# Patient Record
Sex: Male | Born: 2000 | Hispanic: Yes | Marital: Single | Smoking: Never smoker
Health system: Southern US, Community
[De-identification: ages and names within clinical notes are randomized; demographics above are authoritative.]

## PROBLEM LIST (undated history)

## (undated) DIAGNOSIS — E039 Hypothyroidism, unspecified: Secondary | ICD-10-CM

## (undated) HISTORY — DX: Hypothyroidism, unspecified: E03.9

## (undated) HISTORY — PX: MOUTH SURGERY: SHX715

## (undated) HISTORY — PX: EXCISION OF BACK LESION: SHX6597

---
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
## 2022-02-17 ENCOUNTER — Other Ambulatory Visit (INDEPENDENT_AMBULATORY_CARE_PROVIDER_SITE_OTHER): Payer: Self-pay | Admitting: Family

## 2022-02-17 DIAGNOSIS — E039 Hypothyroidism, unspecified: Secondary | ICD-10-CM

## 2022-03-16 ENCOUNTER — Encounter (INDEPENDENT_AMBULATORY_CARE_PROVIDER_SITE_OTHER): Payer: Self-pay | Admitting: Family

## 2022-03-16 ENCOUNTER — Ambulatory Visit (INDEPENDENT_AMBULATORY_CARE_PROVIDER_SITE_OTHER): Payer: Medicaid Other | Admitting: Family

## 2022-03-16 VITALS — BP 124/80 | HR 75 | Wt 196.0 lb

## 2022-03-16 DIAGNOSIS — E039 Hypothyroidism, unspecified: Secondary | ICD-10-CM | POA: Diagnosis not present

## 2022-03-16 DIAGNOSIS — Z23 Encounter for immunization: Secondary | ICD-10-CM

## 2022-03-16 NOTE — Patient Instructions (Signed)

## 2022-03-16 NOTE — Progress Notes (Signed)
Pediatric Endocrinology Consultation Follow up Visit  [name], [name] 2001/01/30  Gregg Mayhew, NP (Inactive)  Chief Complaint: Elevated TSH  History obtained from: Gregg [name], his mother, and review of records from PCP  HPI: Gregg [name]  is a 21 y.o. male being seen in consultation at the request of  Gregg Mayhew, NP (Inactive) for evaluation of elevated TSH.  he is accompanied to this visit by his Mother and Spanish interpreter.   1. Gregg [name] was seen by his PCP on 01/2018 for well child exam. Routine annual labs were drawn after the visit which showed and elevated TSH of 11.71 and FT4 of 1.13. At his first visit to clinic, his thyroid antibodies were positive and confirmed autoimmune thyroid disease.Gregg [name] He was started on 25 mcg of levothyroxine per day.   2. Since his last visit to clinic on 09/2021, he has been well.   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 He is taking 25 mcg of levothyroxine every morning. Rarely forgets a dose. Denies fatigue, constipation and cold intolerance.     ROS: Greater than 10 systems reviewed with pertinent positives listed in HPI, otherwise neg. Constitutional: Sleeping well. 13 lbs weight gain   Eyes: No changes in vision Ears/Nose/Mouth/Throat: No difficulty swallowing. No neck pain  Cardiovascular: No palpitations. No chest pain  Respiratory: No increased work of breathing Gastrointestinal: No constipation or diarrhea. No abdominal pain Genitourinary: No nocturia, no polyuria Musculoskeletal: No joint pain Neurologic: Normal sensation, no tremor Endocrine: As above Psychiatric: Normal affect   Past Medical History:  Past Medical History:  Diagnosis Date   Hypothyroidism     Birth History: Pregnancy uncomplicated. Delivered at 38 weeks.  Discharged home with mom  Meds: Outpatient Encounter Medications as of 03/16/2022  Medication Sig   levothyroxine  (SYNTHROID) 25 MCG tablet Take 1 tablet by mouth once daily   No facility-administered encounter medications on file as of 03/16/2022.    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   Family History:  Family History  Problem Relation Age of Onset   Thyroid disease Paternal Grandmother    Diabetes type II Paternal Grandmother    Diabetes type II Paternal Grandfather    Maternal Grandmother: hypothyroidism.   Social History: Lives with: Mother, father and 75 year old brother.  Currently in school at [employer]   Physical Exam:  Vitals:   03/16/22 0828  BP: 124/80  Pulse: 75  Weight: 196 lb (88.9 kg)     BP 124/80   Pulse 75   Wt 196 lb (88.9 kg)   BMI 29.10 kg/m  Body mass index: body mass index is 29.1 kg/m. Blood pressure %iles are not available for patients who are 18 years or older.  Wt Readings from Last 3 Encounters:  03/16/22 196 lb (88.9 kg) (91 %, Z= 1.33)*  09/14/21 183 lb 6.4 oz (83.2 kg) (85 %, Z= 1.05)*  03/18/21 181 lb (82.1 kg) (85 %, Z= 1.04)*   * Growth percentiles are based on CDC (Boys, 2-20 Years) data.   Ht Readings from Last 3 Encounters:  03/18/21 5' 8.82" (1.748 m) (41 %, Z= -0.23)*  09/16/20 5' 8.9" (1.75 m) (43 %, Z= -0.17)*  03/09/20 5' 9.09" (1.755 m) (48 %, Z= -0.05)*   * Growth percentiles are based on CDC (Boys, 2-20 Years) data.   Body mass index is 29.1 kg/m. @  BMIFA@ 91 %ile (Z= 1.33) based on CDC (Boys, 2-20 Years) weight-for-age data using vitals from 03/16/2022. No height on file for this encounter.  General: Well developed, well nourished male in no acute distress.   Head: Normocephalic, atraumatic.   Eyes:  Pupils equal and round. EOMI.  Sclera white.  No eye drainage.   Ears/Nose/Mouth/Throat: Nares patent, no nasal drainage.  Normal dentition, mucous membranes moist.  Neck: supple, no cervical lymphadenopathy, no thyromegaly Cardiovascular: regular rate, normal S1/S2, no  murmurs Respiratory: No increased work of breathing.  Lungs clear to auscultation bilaterally.  No wheezes. Abdomen: soft, nontender, nondistended. Normal bowel sounds.  No appreciable masses  Extremities: warm, well perfused, cap refill < 2 sec.   Musculoskeletal: Normal muscle mass.  Normal strength Skin: warm, dry.  No rash or lesions. Neurologic: alert and oriented, normal speech, no tremor    Laboratory Evaluation:     Assessment/Plan: Gregg [name] is a 21 y.o. 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 He is clinically euthyroid on 25 mcg of levothyroxine per day.   1. Aquired hypothyroidism.  2. Abnormal thyroid Function Test - 25 mcg of levothyroxine per day  - TSh, Ft4 and T4 ordered  - Reviewed growth chart.  - Discussed s/s of hypothyroidism.   Influenza vaccine given, counseling provided.    Follow-up:   6 months   Medical decision-making:  >30  spent today reviewing the medical chart, counseling the patient/family, and documenting today's visit.      Gretchen Short,  FNP-C  Pediatric Specialist  [address]  [place], [address]  Tele: [phone]

## 2022-03-17 LAB — TSH: TSH: 4.34 mIU/L — ABNORMAL HIGH (ref 0.50–4.30)

## 2022-03-17 LAB — T4, FREE: Free T4: 1.6 ng/dL — ABNORMAL HIGH (ref 0.8–1.4)

## 2022-03-17 LAB — T4: T4, Total: 6.5 ug/dL (ref 5.1–10.3)

## 2022-03-21 ENCOUNTER — Other Ambulatory Visit (INDEPENDENT_AMBULATORY_CARE_PROVIDER_SITE_OTHER): Payer: Self-pay

## 2022-03-21 DIAGNOSIS — E039 Hypothyroidism, unspecified: Secondary | ICD-10-CM

## 2022-03-21 MED ORDER — LEVOTHYROXINE SODIUM 25 MCG PO TABS: 25.0000 ug | ORAL_TABLET | Freq: Every day | ORAL | 2 refills | Status: DC

## 2022-09-20 ENCOUNTER — Encounter (INDEPENDENT_AMBULATORY_CARE_PROVIDER_SITE_OTHER): Payer: Self-pay | Admitting: Family

## 2022-09-20 ENCOUNTER — Ambulatory Visit (INDEPENDENT_AMBULATORY_CARE_PROVIDER_SITE_OTHER): Payer: Self-pay | Admitting: Family

## 2022-09-20 VITALS — BP 118/72 | HR 68 | Wt 206.0 lb

## 2022-09-20 DIAGNOSIS — E039 Hypothyroidism, unspecified: Secondary | ICD-10-CM

## 2022-09-20 NOTE — Progress Notes (Signed)
Pediatric Endocrinology Consultation Follow up Visit  Gregg [name], Gregg [name] 04-29-2001  Gregg Crazier, NP (Inactive)  Chief Complaint: Elevated TSH  History obtained from: Gregg [name], his mother, and review of records from PCP  HPI: Gregg [name]  is a 22 y.o. male being seen in consultation at the request of  Gregg Crazier, NP (Inactive) for evaluation of elevated TSH.  he is accompanied to this visit by his Mother and Spanish interpreter.   1. Gregg [name] was seen by his PCP on 01/2018 for well child exam. Routine annual labs were drawn after the visit which showed and elevated TSH of 11.71 and FT4 of 1.13. At his first visit to clinic, his thyroid antibodies were positive and confirmed autoimmune thyroid disease.Marland Kitchen He was started on 25 mcg of levothyroxine per day.   2. Since his last visit to clinic on 02/2022 , he has been well.   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 He goes to the gym a few days per week for activity. Eats healthy diet.   He is taking 25 mcg of levothyroxine per day. Rarely misses a dose and takes in the morning. Denies fatigue, cold intolerance, and constipation. His only concern is hair loss over the last week.   ROS: Greater than 10 systems reviewed with pertinent positives listed in HPI, otherwise neg. Constitutional: Sleeping well. + 10lbs weight gain.  Eyes: No changes in vision Ears/Nose/Mouth/Throat: No difficulty swallowing. No neck pain  Cardiovascular: No palpitations. No chest pain  Respiratory: No increased work of breathing Gastrointestinal: No constipation or diarrhea. No abdominal pain Genitourinary: No nocturia, no polyuria Musculoskeletal: No joint pain Neurologic: Normal sensation, no tremor Endocrine: As above Psychiatric: Normal affect   Past Medical History:  Past Medical History:  Diagnosis Date   Hypothyroidism     Birth History: Pregnancy uncomplicated. Delivered at 38 weeks.  Discharged home with  mom  Meds: Outpatient Encounter Medications as of 09/20/2022  Medication Sig   levothyroxine (SYNTHROID) 25 MCG tablet Take 1 tablet (25 mcg total) by mouth daily.   No facility-administered encounter medications on file as of 09/20/2022.    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   Family History:  Family History  Problem Relation Age of Onset   Thyroid disease Paternal Grandmother    Diabetes type II Paternal Grandmother    Diabetes type II Paternal Grandfather    Maternal Grandmother: hypothyroidism.   Social History: Lives with: Mother, father and 61 year old brother.  Currently in school at [employer]   Physical Exam:  Vitals:   09/20/22 1508  BP: 118/72  Pulse: 68  Weight: 206 lb (93.4 kg)      BP 118/72   Pulse 68   Wt 206 lb (93.4 kg)   BMI 30.58 kg/m  Body mass index: body mass index is 30.58 kg/m. Growth %ile SmartLinks can only be used for patients less than 56 years old.  Wt Readings from Last 3 Encounters:  09/20/22 206 lb (93.4 kg)  03/16/22 196 lb (88.9 kg) (91 %, Z= 1.33)*  09/14/21 183 lb 6.4 oz (83.2 kg) (85 %, Z= 1.05)*   * Growth percentiles are based on CDC (Boys, 2-20 Years) data.   Ht Readings from Last 3 Encounters:  03/18/21 5' 8.82" (1.748 m) (41 %, Z= -0.23)*  09/16/20 5' 8.9" (1.75 m) (43 %, Z= -0.17)*  03/09/20 5' 9.09" (1.755 m) (48 %, Z= -0.05)*   * Growth  percentiles are based on CDC (Boys, 2-20 Years) data.   Body mass index is 30.58 kg/m. @BMIFA @ Facility age limit for growth %iles is 20 years. Facility age limit for growth %iles is 20 years.  General: Well developed, well nourished male in no acute distress.   Head: Normocephalic, atraumatic.   Eyes:  Pupils equal and round. EOMI.  Sclera white.  No eye drainage.   Ears/Nose/Mouth/Throat: Nares patent, no nasal drainage.  Normal dentition, mucous membranes moist.  Neck: supple, no cervical lymphadenopathy, no  thyromegaly Cardiovascular: regular rate, normal S1/S2, no murmurs Respiratory: No increased work of breathing.  Lungs clear to auscultation bilaterally.  No wheezes. Abdomen: soft, nontender, nondistended. Normal bowel sounds.  No appreciable masses  Extremities: warm, well perfused, cap refill < 2 sec.   Musculoskeletal: Normal muscle mass.  Normal strength Skin: warm, dry.  No rash or lesions. Neurologic: alert and oriented, normal speech, no tremor    Laboratory Evaluation:     Assessment/Plan: Gregg [name] is a 22 y.o. 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 He is on levothyroxine and clinically euthyroid.   1. Aquired hypothyroidism.  2. Abnormal thyroid Function Test - Reviewed and discussed growth chart.  - TSH, FT4 and T4 ordered  - 25 mcg of levothyroxine per day.      Follow-up:   6 months   Medical decision-making:  LOS: >30  spent today reviewing the medical chart, counseling the patient/family, and documenting today's visit.     Gregg Short,  FNP-C  Pediatric Specialist  [address]  [place], [address]  Tele: [phone]

## 2022-09-20 NOTE — Patient Instructions (Signed)
It was a pleasure seeing you in clinic today. Please do not hesitate to contact me if you have questions or concerns.  ° °Please sign up for MyChart. This is a communication tool that allows you to send an email directly to me. This can be used for questions, prescriptions and blood sugar reports. We will also release labs to you with instructions on MyChart. Please do not use MyChart if you need immediate or emergency assistance. Ask our wonderful front office staff if you need assistance.  ° °

## 2022-09-21 LAB — TSH: TSH: 4.66 mIU/L — ABNORMAL HIGH (ref 0.40–4.50)

## 2022-09-21 LAB — T4, FREE: Free T4: 1.6 ng/dL — ABNORMAL HIGH (ref 0.8–1.4)

## 2022-09-21 LAB — T4: T4, Total: 6.7 ug/dL (ref 5.1–10.3)

## 2022-12-16 ENCOUNTER — Other Ambulatory Visit (INDEPENDENT_AMBULATORY_CARE_PROVIDER_SITE_OTHER): Payer: Self-pay | Admitting: Family

## 2022-12-16 DIAGNOSIS — E039 Hypothyroidism, unspecified: Secondary | ICD-10-CM

## 2023-03-22 ENCOUNTER — Encounter (INDEPENDENT_AMBULATORY_CARE_PROVIDER_SITE_OTHER): Payer: Self-pay

## 2023-03-22 ENCOUNTER — Ambulatory Visit (INDEPENDENT_AMBULATORY_CARE_PROVIDER_SITE_OTHER): Payer: Medicaid Other | Admitting: Family

## 2023-03-22 ENCOUNTER — Telehealth (INDEPENDENT_AMBULATORY_CARE_PROVIDER_SITE_OTHER): Payer: Self-pay | Admitting: Family

## 2023-03-22 ENCOUNTER — Other Ambulatory Visit (INDEPENDENT_AMBULATORY_CARE_PROVIDER_SITE_OTHER): Payer: Self-pay | Admitting: Family

## 2023-03-22 DIAGNOSIS — E039 Hypothyroidism, unspecified: Secondary | ICD-10-CM

## 2023-03-22 NOTE — Progress Notes (Deleted)
Pediatric Endocrinology Consultation Follow up Visit  Gregg [name], Gregg [name] [birth year]  Melanie Crazier, NP (Inactive)  Chief Complaint: Elevated TSH  History obtained from: Victor, his mother, and review of records from PCP  HPI: Gregg [name]  is a 22 y.o. male being seen in consultation at the request of  Melanie Crazier, NP (Inactive) for evaluation of elevated TSH.  he is accompanied to this visit by his Mother and Spanish interpreter.   1. Gregg [name] was seen by his PCP on 01/2018 for well child exam. Routine annual labs were drawn after the visit which showed and elevated TSH of 11.71 and FT4 of 1.13. At his first visit to clinic, his thyroid antibodies were positive and confirmed autoimmune thyroid disease.Marland Kitchen He was started on 25 mcg of levothyroxine per day.   2. Since his last visit to clinic on  09/2022, he has been well.   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 He goes to the gym a few days per week for activity. Eats healthy diet.   He is taking 25 mcg of levothyroxine per day. Rarely misses a dose and takes in the morning. Denies fatigue, cold intolerance, and constipation. His only concern is hair loss over the last week.   ROS: Greater than 10 systems reviewed with pertinent positives listed in HPI, otherwise neg. Constitutional: Sleeping well. + 10lbs weight gain.  Eyes: No changes in vision Ears/Nose/Mouth/Throat: No difficulty swallowing. No neck pain  Cardiovascular: No palpitations. No chest pain  Respiratory: No increased work of breathing Gastrointestinal: No constipation or diarrhea. No abdominal pain Genitourinary: No nocturia, no polyuria Musculoskeletal: No joint pain Neurologic: Normal sensation, no tremor Endocrine: As above Psychiatric: Normal affect   Past Medical History:  Past Medical History:  Diagnosis Date   Hypothyroidism     Birth History: Pregnancy uncomplicated. Delivered at 38 weeks.  Discharged home with  mom  Meds: Outpatient Encounter Medications as of 03/22/2023  Medication Sig   levothyroxine (SYNTHROID) 25 MCG tablet Take 1 tablet by mouth once daily   No facility-administered encounter medications on file as of 03/22/2023.    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   Family History:  Family History  Problem Relation Age of Onset   Thyroid disease Paternal Grandmother    Diabetes type II Paternal Grandmother    Diabetes type II Paternal Grandfather    Maternal Grandmother: hypothyroidism.   Social History: Lives with: Mother, father and 42 year old brother.  Currently in school at [employer]   Physical Exam:  There were no vitals filed for this visit.     There were no vitals taken for this visit. Body mass index: body mass index is unknown because there is no height or weight on file. Growth %ile SmartLinks can only be used for patients less than 62 years old.  Wt Readings from Last 3 Encounters:  09/20/22 206 lb (93.4 kg)  03/16/22 196 lb (88.9 kg) (91%, Z= 1.33)*  09/14/21 183 lb 6.4 oz (83.2 kg) (85%, Z= 1.05)*   * Growth percentiles are based on CDC (Boys, 2-20 Years) data.   Ht Readings from Last 3 Encounters:  03/18/21 5' 8.82" (1.748 m) (41%, Z= -0.23)*  09/16/20 5' 8.9" (1.75 m) (43%, Z= -0.17)*  03/09/20 5' 9.09" (1.755 m) (48%, Z= -0.05)*   * Growth percentiles are based on CDC (Boys, 2-20 Years) data.   There is no height or weight on file to  calculate BMI. @BMIFA @ Facility age limit for growth %iles is 20 years. Facility age limit for growth %iles is 20 years.  General: Well developed, well nourished male in no acute distress.   Head: Normocephalic, atraumatic.   Eyes:  Pupils equal and round. EOMI.  Sclera white.  No eye drainage.   Ears/Nose/Mouth/Throat: Nares patent, no nasal drainage.  Normal dentition, mucous membranes moist.  Neck: supple, no cervical lymphadenopathy, no  thyromegaly Cardiovascular: regular rate, normal S1/S2, no murmurs Respiratory: No increased work of breathing.  Lungs clear to auscultation bilaterally.  No wheezes. Abdomen: soft, nontender, nondistended. Normal bowel sounds.  No appreciable masses  Extremities: warm, well perfused, cap refill < 2 sec.   Musculoskeletal: Normal muscle mass.  Normal strength Skin: warm, dry.  No rash or lesions. Neurologic: alert and oriented, normal speech, no tremor    Laboratory Evaluation:     Assessment/Plan: Gregg [name] is a 22 y.o. 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 He is on levothyroxine and clinically euthyroid.   1. Aquired hypothyroidism.  2. Abnormal thyroid Function Test - 25 mcg of levothyroxine per day  - Discussed s/s of hypothyroidism  -     Follow-up:   6 months   Medical decision-making:  LOS: >30  spent today reviewing the medical chart, counseling the patient/family, and documenting today's visit.     Gretchen Short,  FNP-C  Pediatric Specialist  [address]  [place], [address]  Tele: [phone]

## 2023-03-22 NOTE — Telephone Encounter (Signed)
  Name of who is calling: Gregg [name]   Caller's Relationship to Patient:  Best contact number: [phone]  Provider they see: Ovidio Kin   Reason for call: Called to rs missed appt from today, says he thought it was for tomorrow. He would like a call back for wether or not to still come in for labs this week or closer to his rs appt in January.      PRESCRIPTION REFILL ONLY  Name of prescription:  Pharmacy:

## 2023-03-31 LAB — T4, FREE: Free T4: 1.7 ng/dL — ABNORMAL HIGH (ref 0.8–1.4)

## 2023-03-31 LAB — TSH: TSH: 4.24 m[IU]/L (ref 0.40–4.50)

## 2023-03-31 LAB — T4: T4, Total: 6.8 ug/dL (ref 5.1–10.3)

## 2023-06-19 ENCOUNTER — Encounter (INDEPENDENT_AMBULATORY_CARE_PROVIDER_SITE_OTHER): Payer: Self-pay | Admitting: Family

## 2023-06-19 ENCOUNTER — Ambulatory Visit (INDEPENDENT_AMBULATORY_CARE_PROVIDER_SITE_OTHER): Payer: No Typology Code available for payment source | Admitting: Family

## 2023-06-19 VITALS — BP 128/84 | HR 86 | Wt 206.2 lb

## 2023-06-19 DIAGNOSIS — E039 Hypothyroidism, unspecified: Secondary | ICD-10-CM

## 2023-06-19 DIAGNOSIS — E063 Autoimmune thyroiditis: Secondary | ICD-10-CM

## 2023-06-19 NOTE — Patient Instructions (Signed)

## 2023-06-19 NOTE — Progress Notes (Signed)
 Pediatric Endocrinology Consultation Follow up Visit  Gregg, [name] [birth year]  Gregg Barns, NP (Inactive)  Chief Complaint: Elevated TSH  History obtained from: Gregg [name], his mother, and review of records from PCP  HPI: Gregg [name]  is a 23 y.o. male being seen in consultation at the request of  Gregg Barns, NP (Inactive) for evaluation of elevated TSH.  he is accompanied to this visit by his Mother and Spanish interpreter.   1. Gregg [name] was seen by his PCP on 01/2018 for well child exam. Routine annual labs were drawn after the visit which showed and elevated TSH of 11.71 and FT4 of 1.13. At his first visit to clinic, his thyroid  antibodies were positive and confirmed autoimmune thyroid  disease.Gregg [name] He was started on 25 mcg of levothyroxine  per day.   2. Since his last visit to clinic on 09/2022 , he has been well.   He is working full time as a games developer. He is working out at [employer] 3-4 x per week and goes for walks. Feels like his diet has been good overall.   He takes 25 mcg of levothyroxine  every morning. He has only missed a few doses. He denies fatigue, constipation and cold intolerance.   ROS: Greater than 10 systems reviewed with pertinent positives listed in HPI, otherwise neg. Constitutional: Sleeping well. Weight is stable   Eyes: No changes in vision Ears/Nose/Mouth/Throat: No difficulty swallowing. No neck pain  Cardiovascular: No palpitations. No chest pain  Respiratory: No increased work of breathing Gastrointestinal: No constipation or diarrhea. No abdominal pain Genitourinary: No nocturia, no polyuria Musculoskeletal: No joint pain Neurologic: Normal sensation, no tremor Endocrine: As above Psychiatric: Normal affect   Past Medical History:  Past Medical History:  Diagnosis Date   Hypothyroidism     Birth History: Pregnancy uncomplicated. Delivered at 38 weeks.  Discharged home with mom  Meds: Outpatient Encounter Medications as of 06/19/2023   Medication Sig   levothyroxine  (SYNTHROID ) 25 MCG tablet Take 1 tablet by mouth once daily   No facility-administered encounter medications on file as of 06/19/2023.    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   Family History:  Family History  Problem Relation Age of Onset   Thyroid  disease Paternal Grandmother    Diabetes type II Paternal Grandmother    Diabetes type II Paternal Grandfather    Maternal Grandmother: hypothyroidism.   Social History: Lives with: Mother, father and 75 year old brother.  Currently in school at [employer]   Physical Exam:  Vitals:   06/19/23 1314  BP: 128/84  Pulse: 86  Weight: 206 lb 3.2 oz (93.5 kg)     BP 128/84 (BP Location: Left Arm, Patient Position: Sitting, Cuff Size: Normal)   Pulse 86   Wt 206 lb 3.2 oz (93.5 kg)   BMI 30.61 kg/m  Body mass index: body mass index is 30.61 kg/m. Growth %ile SmartLinks can only be used for patients less than 14 years old.  Wt Readings from Last 3 Encounters:  06/19/23 206 lb 3.2 oz (93.5 kg)  09/20/22 206 lb (93.4 kg)  03/16/22 196 lb (88.9 kg) (91%, Z= 1.33)*   * Growth percentiles are based on CDC (Boys, 2-20 Years) data.   Ht Readings from Last 3 Encounters:  03/18/21 5' 8.82 (1.748 m) (41%, Z= -0.23)*  09/16/20 5' 8.9 (1.75 m) (43%, Z= -0.17)*  03/09/20 5' 9.09 (1.755 m) (48%, Z= -0.05)*   * Growth percentiles are based on CDC (Boys,  2-20 Years) data.   Body mass index is 30.61 kg/m. @BMIFA @ Facility age limit for growth %iles is 20 years. Facility age limit for growth %iles is 20 years.  General: Well developed, well nourished male in no acute distress.   Head: Normocephalic, atraumatic.   Eyes:  Pupils equal and round. EOMI.  Sclera white.  No eye drainage.   Ears/Nose/Mouth/Throat: Nares patent, no nasal drainage.  Normal dentition, mucous membranes moist.  Neck: supple, no cervical lymphadenopathy, no thyromegaly Cardiovascular:  regular rate, normal S1/S2, no murmurs Respiratory: No increased work of breathing.  Lungs clear to auscultation bilaterally.  No wheezes. Abdomen: soft, nontender, nondistended. Normal bowel sounds.  No appreciable masses  Extremities: warm, well perfused, cap refill < 2 sec.   Musculoskeletal: Normal muscle mass.  Normal strength Skin: warm, dry.  No rash or lesions. Neurologic: alert and oriented, normal speech, no tremor    Laboratory Evaluation:     Assessment/Plan: Gregg [name] is a 23 y.o. 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 Gregg [name] is clinically euthyroid on 25 mcg of levothyroxine  per day. Due for labs today.   1. Aquired hypothyroidism.  - TSH, FT4 and T4 ordered  - 25 mcg of levothyroxine  per day  - Discussed s/s of hypothyroidism  - Reviewed growth chart.     Follow-up:   6 months   Medical decision-making:  LOS: 21 minutes spent today reviewing the medical chart, counseling the patient/family, and documenting today's visit.     Gregg Penton,  FNP-C  Pediatric Specialist  [address]  [place], [address]  Tele: [phone]

## 2023-06-20 ENCOUNTER — Other Ambulatory Visit (INDEPENDENT_AMBULATORY_CARE_PROVIDER_SITE_OTHER): Payer: Self-pay | Admitting: Family

## 2023-06-20 DIAGNOSIS — E039 Hypothyroidism, unspecified: Secondary | ICD-10-CM

## 2023-06-20 LAB — T4, FREE: Free T4: 1.6 ng/dL — ABNORMAL HIGH (ref 0.8–1.4)

## 2023-06-20 LAB — T4: T4, Total: 6.9 ug/dL (ref 5.1–10.3)

## 2023-06-20 LAB — TSH: TSH: 4.22 m[IU]/L (ref 0.40–4.50)

## 2023-06-20 MED ORDER — LEVOTHYROXINE SODIUM 25 MCG PO TABS: 25.0000 ug | ORAL_TABLET | Freq: Every day | ORAL | 1 refills | Status: DC

## 2023-09-18 ENCOUNTER — Encounter (INDEPENDENT_AMBULATORY_CARE_PROVIDER_SITE_OTHER): Payer: Self-pay

## 2023-10-01 ENCOUNTER — Encounter (INDEPENDENT_AMBULATORY_CARE_PROVIDER_SITE_OTHER): Payer: Self-pay

## 2023-10-24 ENCOUNTER — Encounter (INDEPENDENT_AMBULATORY_CARE_PROVIDER_SITE_OTHER): Payer: Self-pay | Admitting: Family

## 2023-10-24 ENCOUNTER — Ambulatory Visit (INDEPENDENT_AMBULATORY_CARE_PROVIDER_SITE_OTHER): Admitting: Family

## 2023-10-24 VITALS — BP 126/78 | HR 76 | Wt 211.0 lb

## 2023-10-24 DIAGNOSIS — E039 Hypothyroidism, unspecified: Secondary | ICD-10-CM | POA: Diagnosis not present

## 2023-10-24 NOTE — Progress Notes (Signed)
 Pediatric Endocrinology Consultation Follow up Visit  Gregg Gregg [name], Gregg Gregg [name] 11-25-2000  Gregg Hunt, NP (Inactive)  Chief Complaint: Elevated TSH  History obtained from: Gregg Gregg [name], Gregg Gregg [name], and review of records from PCP  HPI: Gregg Gregg [name]  is a 23 y.o. male being seen in consultation at the request of  Gregg Hunt, NP (Inactive) for evaluation of elevated TSH.  he is accompanied to this visit by Gregg Gregg [name] and Spanish interpreter.   1. Gregg Gregg [name] was seen by Gregg PCP on 01/2018 for well child exam. Routine annual labs were drawn after the visit which showed and elevated TSH of 11.71 and FT4 of 1.13. At Gregg first visit to clinic, Gregg thyroid  antibodies were positive and confirmed autoimmune thyroid  disease.Gregg Gregg [name] He was started on 25 mcg of levothyroxine  per day.   2. Since Gregg last visit to clinic on 06/2023, he has been well.   He is staying active with work and playing soccer/going to the gym. Diet is healthy and balanced.   Takes 25 mcg of levothyroxine  per day, rarely misses a dose. Denies fatigue, constipation and cold intolerance.   ROS: Greater than 10 systems reviewed with pertinent positives listed in HPI, otherwise neg. Constitutional: Sleeping well HEENT: NO vision changes. No difficulty swallowing.  Respiratory: No increased work of breathing currently GI: No constipation or diarrhea Musculoskeletal: No joint deformity Neuro: Normal affect. No tremors.  Endocrine: As above    Past Medical History:  Past Medical History:  Diagnosis Date   Hypothyroidism     Birth History: Pregnancy uncomplicated. Delivered at 38 weeks.  Discharged home with mom  Meds: Outpatient Encounter Medications as of 10/24/2023  Medication Sig   levothyroxine  (SYNTHROID ) 25 MCG tablet Take 1 tablet (25 mcg total) by mouth daily.   No facility-administered encounter medications on file as of 10/24/2023.    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    Family History:  Family History  Problem Relation Age of Onset   Thyroid  disease Paternal Grandmother    Diabetes type II Paternal Grandmother    Diabetes type II Paternal Grandfather    Maternal Grandmother: hypothyroidism.   Social History: Lives with: Gregg [name], father and 1 year old brother.  Currently in school at [employer]   Physical Exam:  Vitals:   10/24/23 1342  BP: 126/78  Pulse: 76  Weight: 211 lb (95.7 kg)      BP 126/78   Pulse 76   Wt 211 lb (95.7 kg)   BMI 31.32 kg/m  Body mass index: body mass index is 31.32 kg/m. Growth %ile SmartLinks can only be used for patients less than 74 years old.  Wt Readings from Last 3 Encounters:  10/24/23 211 lb (95.7 kg)  06/19/23 206 lb 3.2 oz (93.5 kg)  09/20/22 206 lb (93.4 kg)   Ht Readings from Last 3 Encounters:  03/18/21 5' 8.82" (1.748 m) (41%, Z= -0.23)*  09/16/20 5' 8.9" (1.75 m) (43%, Z= -0.17)*  03/09/20 5' 9.09" (1.755 m) (48%, Z= -0.05)*   * Growth percentiles are based on CDC (Boys, 2-20 Years) data.   Body mass index is 31.32 kg/m. @BMIFA @ Facility age limit for growth %iles is 20 years. Facility age limit for growth %iles is 20 years.  General: Well developed, well nourished male in no acute distress.   Head: Normocephalic, atraumatic.   Eyes:  Pupils equal and round. EOMI.  Sclera white.  No eye drainage.   Ears/Nose/Mouth/Throat: Nares patent, no nasal drainage.  Normal dentition,  mucous membranes moist.  Neck: supple, no cervical lymphadenopathy, no thyromegaly Cardiovascular: regular rate, normal S1/S2, no murmurs Respiratory: No increased work of breathing.  Lungs clear to auscultation bilaterally.  No wheezes. Abdomen: soft, nontender, nondistended. Normal bowel sounds.  No appreciable masses  Extremities: warm, well perfused, cap refill < 2 sec.   Musculoskeletal: Normal muscle mass.  Normal strength Skin: warm, dry.  No rash or lesions. Neurologic: alert and oriented, normal speech, no  tremor    Laboratory Evaluation:     Assessment/Plan: Gregg Gregg [name] is a 23 y.o. 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 He is clinically euthyroid on levothyroxine .   1. Aquired hypothyroidism.  - 25 mcg of levothyroxine   - Discussed s/s of hypothyroidism.  - Follow up with PCP for management of hypothyroidism. Discussed in detail with patient.  Lab Orders         TSH         T4, free         T4       Follow-up:   6 months with PCP.   Medical decision-making:  LOS: 19 minutes  spent today reviewing the medical chart, counseling the patient/family, and documenting today's visit.    Candee Cha, DNP, FNP-C  Pediatric Specialist  [address]  [place], [address]  Tele: [phone]

## 2023-10-24 NOTE — Patient Instructions (Signed)
 It was a pleasure seeing you in clinic today. Please do not hesitate to contact me if you have questions or concerns.   Please sign up for MyChart. This is a communication tool that allows you to send an email directly to me. This can be used for questions, prescriptions and blood sugar reports. We will also release labs to you with instructions on MyChart. Please do not use MyChart if you need immediate or emergency assistance. Ask our wonderful front office staff if you need assistance.   -Take your medication at the same time every day -Try to take it on an empty stomach -If you forget to take a dose, take it as soon as you remember.  If you don't remember until the next day, take 2 doses then.  NEVER take more than 2 doses at a time. -Use a pill box to help make it easier to keep track of doses   - FOllow up with PCP for hypothyroidism. If PCP is not comfortable managing then refer to adult endocrinology   - 25 mcg of levothyroxine  per day

## 2023-10-25 LAB — T4: T4, Total: 2.4 ug/dL — ABNORMAL LOW (ref 4.9–10.5)

## 2023-10-25 LAB — T4, FREE: Free T4: 0.9 ng/dL (ref 0.8–1.8)

## 2023-10-25 LAB — TSH: TSH: 112.36 m[IU]/L — ABNORMAL HIGH (ref 0.40–4.50)

## 2023-10-29 ENCOUNTER — Other Ambulatory Visit (INDEPENDENT_AMBULATORY_CARE_PROVIDER_SITE_OTHER): Payer: Self-pay | Admitting: Family

## 2023-10-29 ENCOUNTER — Ambulatory Visit (INDEPENDENT_AMBULATORY_CARE_PROVIDER_SITE_OTHER): Payer: Self-pay | Admitting: Family

## 2023-10-29 DIAGNOSIS — E039 Hypothyroidism, unspecified: Secondary | ICD-10-CM

## 2023-10-29 MED ORDER — LEVOTHYROXINE SODIUM 50 MCG PO TABS: 50.0000 ug | ORAL_TABLET | Freq: Every day | ORAL | 3 refills | Status: DC

## 2023-11-10 LAB — TSH: TSH: >150 m[IU]/L — ABNORMAL HIGH (ref 0.40–4.50)

## 2023-11-10 LAB — T4: T4, Total: 2.7 ug/dL — ABNORMAL LOW (ref 4.9–10.5)

## 2023-11-10 LAB — T4, FREE: Free T4: 0.8 ng/dL (ref 0.8–1.8)

## 2023-11-14 ENCOUNTER — Ambulatory Visit (INDEPENDENT_AMBULATORY_CARE_PROVIDER_SITE_OTHER): Payer: Self-pay | Admitting: Family

## 2023-11-14 ENCOUNTER — Other Ambulatory Visit (INDEPENDENT_AMBULATORY_CARE_PROVIDER_SITE_OTHER): Payer: Self-pay | Admitting: Family

## 2023-11-14 DIAGNOSIS — E039 Hypothyroidism, unspecified: Secondary | ICD-10-CM

## 2023-11-19 ENCOUNTER — Other Ambulatory Visit (INDEPENDENT_AMBULATORY_CARE_PROVIDER_SITE_OTHER): Payer: Self-pay | Admitting: Family

## 2023-11-19 ENCOUNTER — Other Ambulatory Visit

## 2023-11-19 DIAGNOSIS — E063 Autoimmune thyroiditis: Secondary | ICD-10-CM

## 2023-12-17 ENCOUNTER — Encounter (INDEPENDENT_AMBULATORY_CARE_PROVIDER_SITE_OTHER): Payer: Self-pay

## 2023-12-17 ENCOUNTER — Ambulatory Visit (INDEPENDENT_AMBULATORY_CARE_PROVIDER_SITE_OTHER): Payer: No Typology Code available for payment source | Admitting: Family

## 2023-12-27 ENCOUNTER — Other Ambulatory Visit (INDEPENDENT_AMBULATORY_CARE_PROVIDER_SITE_OTHER): Payer: Self-pay

## 2023-12-27 MED ORDER — LEVOTHYROXINE SODIUM 50 MCG PO TABS: 50.0000 ug | ORAL_TABLET | Freq: Every day | ORAL | 1 refills | Status: AC

## 2024-02-18 NOTE — Progress Notes (Signed)
 Subjective :  Patient ID: Gregg [name] is a 23 y.o. male.  HPI: Gregg [name] comes today for f/u of Acquired Hypothyroidism, Obesity and Hyperlipidemia. He denies dysphagia, painful swallowing, N/V, abdominal pain, hematochezia or melena. No CP, SOB or leg pain with exertion. No PND, orthopnea, lightheadedness, presyncope or syncope. No wheezing, cough. He has no new complaints.  The following portions of the patient's history were reviewed and updated as appropriate: allergies, current medications, past family history, past medical history, past social history, past surgical history, and problem list.  Review of Systems  Constitutional:  Negative for chills, fatigue and fever.  HENT:  Negative for ear pain, hearing loss and sinus pain.   Eyes:  Negative for visual disturbance.  Respiratory:  Negative for chest tightness and shortness of breath.   Cardiovascular:  Negative for chest pain, palpitations and leg swelling.  Gastrointestinal:  Negative for abdominal pain, blood in stool, constipation, diarrhea, nausea and vomiting.  Endocrine: Negative for cold intolerance, heat intolerance, polydipsia, polyphagia and polyuria.  Genitourinary:  Negative for dysuria.  Musculoskeletal:  Negative for back pain and myalgias.  Skin:  Negative for rash.  Neurological:  Negative for dizziness, syncope, light-headedness and headaches.  Psychiatric/Behavioral:  Negative for behavioral problems.     Objective  Physical Exam Vitals and nursing note reviewed.  Constitutional:      Appearance: Normal appearance. He is obese.  HENT:     Head: Normocephalic and atraumatic.  Eyes:     Extraocular Movements: Extraocular movements intact.     Conjunctiva/sclera: Conjunctivae normal.     Pupils: Pupils are equal, round, and reactive to light.  Neck:     Vascular: No carotid bruit.  Cardiovascular:     Rate and Rhythm: Normal rate and regular rhythm.     Heart sounds: Normal heart  sounds. No murmur heard.    No gallop.  Pulmonary:     Effort: Pulmonary effort is normal.     Breath sounds: Normal breath sounds. No wheezing.  Abdominal:     General: Bowel sounds are normal.     Palpations: Abdomen is soft.     Tenderness: There is no abdominal tenderness.  Musculoskeletal:        General: Normal range of motion.     Cervical back: Normal range of motion and neck supple. No rigidity or tenderness.     Right lower leg: No edema.     Left lower leg: No edema.  Lymphadenopathy:     Cervical: No cervical adenopathy.  Skin:    General: Skin is warm.     Findings: No erythema.  Neurological:     General: No focal deficit present.     Mental Status: He is alert and oriented to person, place, and time.  Psychiatric:        Mood and Affect: Mood normal.        Behavior: Behavior normal.     Assessment    1. Acquired hypothyroidism (Primary).  Improving. Still not at goal. Medication recently adjusted. He is taking Levothyroxine  100 mcg every day.  PLAN: Continue current treatment. Have labs done in 03/2024 as previously ordered.  2. Obesity (BMI 30-39.9).  He has lost 7lbs in 3 months and working on diet.  PLAN: Continue working on diet.  3. Hyperlipidemia LDL goal <130.  Working on diet.  PLAN: Continue working on diet. - Lipid Panel; 03/2024  Behavioral Health Screening  Patient Health Questionnaire-2 Score: 0 (02/18/2024  3:46 PM)  Patient's Depression screening/score = Negative    Depression Plan: Normal/Negative Screening   Follow-up in 6 months for CPE.     Plan  See above.  This document serves as a record of services personally performed by Dr. Delilah. It was created on their behalf by Pinkey ONEIDA Neve, CMA, a trained medical scribe, and Certified Medical Assistance (CMA). During the course of documenting the history, physical exam and medical decision making, I was functioning as a stage manager. The creation of this record is  the providers dictation and/or activities during the visit.  Electronically signed by Pinkey ONEIDA Neve, CMA 02/18/2024 3:44 PM
